# Patient Record
Sex: Male | Born: 1970 | Race: White | Hispanic: No | Marital: Married | Smoking: Former smoker
Health system: Southern US, Community
[De-identification: ages and names within clinical notes are randomized; demographics above are authoritative.]

## PROBLEM LIST (undated history)

## (undated) DIAGNOSIS — E785 Hyperlipidemia, unspecified: Secondary | ICD-10-CM

## (undated) DIAGNOSIS — K219 Gastro-esophageal reflux disease without esophagitis: Secondary | ICD-10-CM

## (undated) HISTORY — PX: NO PAST SURGERIES: SHX2092

## (undated) HISTORY — DX: Hyperlipidemia, unspecified: E78.5

---

## 2011-03-31 ENCOUNTER — Encounter: Payer: Self-pay | Admitting: Emergency Medicine

## 2011-03-31 ENCOUNTER — Other Ambulatory Visit: Payer: Self-pay

## 2011-03-31 ENCOUNTER — Observation Stay (HOSPITAL_COMMUNITY)
Admission: EM | Admit: 2011-03-31 | Discharge: 2011-04-01 | Disposition: A | Discharge status: Home / Self Care | Payer: Self-pay | Attending: Internal Medicine | Admitting: Internal Medicine

## 2011-03-31 ENCOUNTER — Emergency Department (HOSPITAL_COMMUNITY): Payer: Self-pay

## 2011-03-31 DIAGNOSIS — R209 Unspecified disturbances of skin sensation: Secondary | ICD-10-CM | POA: Insufficient documentation

## 2011-03-31 DIAGNOSIS — R079 Chest pain, unspecified: Secondary | ICD-10-CM

## 2011-03-31 DIAGNOSIS — K219 Gastro-esophageal reflux disease without esophagitis: Secondary | ICD-10-CM

## 2011-03-31 DIAGNOSIS — R0789 Other chest pain: Principal | ICD-10-CM | POA: Insufficient documentation

## 2011-03-31 DIAGNOSIS — E7800 Pure hypercholesterolemia, unspecified: Secondary | ICD-10-CM | POA: Insufficient documentation

## 2011-03-31 DIAGNOSIS — M79609 Pain in unspecified limb: Secondary | ICD-10-CM | POA: Insufficient documentation

## 2011-03-31 DIAGNOSIS — R61 Generalized hyperhidrosis: Secondary | ICD-10-CM | POA: Insufficient documentation

## 2011-03-31 HISTORY — DX: Gastro-esophageal reflux disease without esophagitis: K21.9

## 2011-03-31 LAB — POCT I-STAT TROPONIN I: Troponin i, poc: 0 ng/mL (ref 0.00–0.08)

## 2011-03-31 LAB — BASIC METABOLIC PANEL
BUN: 12 mg/dL (ref 6–23)
CO2: 27 mEq/L (ref 19–32)
Calcium: 9.5 mg/dL (ref 8.4–10.5)
Chloride: 101 mEq/L (ref 96–112)
Creatinine, Ser: 1.19 mg/dL (ref 0.50–1.35)
GFR calc Af Amer: 88 mL/min — ABNORMAL LOW (ref >90)
GFR calc non Af Amer: 76 mL/min — ABNORMAL LOW (ref >90)
Glucose, Bld: 95 mg/dL (ref 70–99)
Potassium: 3.8 mEq/L (ref 3.5–5.1)
Sodium: 138 mEq/L (ref 135–145)

## 2011-03-31 LAB — TROPONIN I: Troponin I: <0.3 ng/mL (ref <0.30)

## 2011-03-31 LAB — CBC
HCT: 41.9 % (ref 39.0–52.0)
Hemoglobin: 14.7 g/dL (ref 13.0–17.0)
MCH: 28.4 pg (ref 26.0–34.0)
MCHC: 35.1 g/dL (ref 30.0–36.0)
MCV: 81 fL (ref 78.0–100.0)
Platelets: 203 10*3/uL (ref 150–400)
RBC: 5.17 MIL/uL (ref 4.22–5.81)
RDW: 12.1 % (ref 11.5–15.5)
WBC: 7 10*3/uL (ref 4.0–10.5)

## 2011-03-31 NOTE — H&P (Signed)
PCP:   No primary provider on file.   Chief Complaint:  Chest pressure x1 week  HPI: Alejandro Delgado is an 40 y.o. Caucasian male.   No prior history of cardiac disease. History of GERD but no medical followup for over 4 years since being unemployed. Takes TUMS episodically. For the past one week has been having retrosternal chest tightness and pressure, fairly constant with no obvious aggravating or relieving factors; not associated with dizziness diaphoresis shortness of breath or palpitations. Has also been having episodic lightning sharp central chest pains, also for one week, lasting for about 1 second, and associated with numbness and tingling in his left arm. Again no obvious aggravating or relieving factors. He has tried no medications for relief, but when he had 4 episodes of the sharp chest pain today he  drove himself to the emergency room to be evaluated.  He has had a normal EKG, negative cardiac enzymes, but because he has no outpatient medical follow up, the hospitalist service has been called to assist with management.  Used to smoke heavily as much as 5 packs cigarettes per day, in addition to chewing tobacco, but discontinued in the mid 1990s. Also used to drink heavily 12-24 beers per day, but discontinued this gradually in the past 5 months. Noticed a significant improvement in his GERD symptoms when he quit tobacco and alcohol.   Despite being told that he needs to have regular colonoscopies  starting at age 70, he has not yet had his first.  Rewiew of Systems:  The patient denies anorexia, fever, weight loss,, vision loss, decreased hearing, hoarseness,  syncope, dyspnea on exertion, peripheral edema, balance deficits, hemoptysis, abdominal pain, melena, hematochezia, severe indigestion/heartburn, hematuria, incontinence, genital sores, muscle weakness, suspicious skin lesions, transient blindness, difficulty walking, depression, unusual weight change, abnormal bleeding, enlarged  lymph nodes, angioedema, and breast masses. Denies insomnia.   Past Medical History  Diagnosis Date  . GERD (gastroesophageal reflux disease)     History reviewed. No pertinent past surgical history.  Medications:  HOME MEDS: Prior to Admission medications   Medication Sig Start Date End Date Taking? Authorizing Provider  aspirin EC 81 MG tablet Take 81 mg by mouth every 4 (four) hours as needed. For tightness in chest    Yes Historical Provider, MD  calcium carbonate (TUMS - DOSED IN MG ELEMENTAL CALCIUM) 500 MG chewable tablet Chew 1 tablet by mouth 5 (five) times daily as needed. For heartburn    Yes Historical Provider, MD     Allergies:  Allergies  Allergen Reactions  . Esomeprazole Palpitations    Allergy to "GenericNexium"; then switched to Nexium and had no problem    Social History:   reports that he quit smoking about 17 years ago. He quit smokeless tobacco use about 17 years ago. His smokeless tobacco use included Chew. He reports that he drinks alcohol. He reports that he does not use illicit drugs.  Family History: Family History  Problem Relation Age of Onset  . Cancer Mother 38    Died about 66yrs  . Cancer Father 41    Died about 4yrs  . Cancer Sister     skin cancer     Physical Exam: Filed Vitals:   03/31/11 1926 03/31/11 1950 03/31/11 2040 03/31/11 2225  BP: 151/91 134/87 130/88 124/56  Pulse: 70 69 62 59  Temp: 98.4 F (36.9 C)     TempSrc: Oral     Resp: 18 17 16    Height: 5\' 10"  (  1.778 m)     Weight: 88.451 kg (195 lb)     SpO2: 100% 98% 97% 98%   Blood pressure 124/56, pulse 59, temperature 98.4 F (36.9 C), temperature source Oral, resp. rate 16, height 5\' 10"  (1.778 m), weight 88.451 kg (195 lb), SpO2 98.00%.  GEN:  Pleasant obese Caucasian gentleman lying in the stretcher in no acute distress; cooperative with exam PSYCH:  alert and oriented x4; does not appear anxious does not appear depressed; affect is normal HEENT: Mucous  membranes pink and anicteric; PERRLA; EOM intact; no cervical lymphadenopathy nor thyromegaly or carotid bruit; no JVD; Breasts:: Not examined CHEST WALL: No tenderness CHEST: Normal respiration, clear to auscultation bilaterally HEART: Regular rate and rhythm; no murmurs rubs or gallops BACK: No kyphosis or scoliosis; no CVA tenderness ABDOMEN: Obese, soft non-tender; no masses, no organomegaly, normal abdominal bowel sounds; Rectal Exam: Not done EXTREMITIES: No bone or joint deformity;  no edema; no ulcerations. Genitalia: not examined PULSES: 2+ and symmetric SKIN: Normal hydration no rash or ulceration CNS: Cranial nerves 2-12 grossly intact no focal neurologic deficit   Labs & Imaging Results for orders placed during the hospital encounter of 03/31/11 (from the past 48 hour(s))  CBC     Status: Normal   Collection Time   03/31/11  7:44 PM      Component Value Range Comment   WBC 7.0  4.0 - 10.5 (K/uL)    RBC 5.17  4.22 - 5.81 (MIL/uL)    Hemoglobin 14.7  13.0 - 17.0 (g/dL)    HCT 78.2  95.6 - 21.3 (%)    MCV 81.0  78.0 - 100.0 (fL)    MCH 28.4  26.0 - 34.0 (pg)    MCHC 35.1  30.0 - 36.0 (g/dL)    RDW 08.6  57.8 - 46.9 (%)    Platelets 203  150 - 400 (K/uL)   BASIC METABOLIC PANEL     Status: Abnormal   Collection Time   03/31/11  7:44 PM      Component Value Range Comment   Sodium 138  135 - 145 (mEq/L)    Potassium 3.8  3.5 - 5.1 (mEq/L)    Chloride 101  96 - 112 (mEq/L)    CO2 27  19 - 32 (mEq/L)    Glucose, Bld 95  70 - 99 (mg/dL)    BUN 12  6 - 23 (mg/dL)    Creatinine, Ser 6.29  0.50 - 1.35 (mg/dL)    Calcium 9.5  8.4 - 10.5 (mg/dL)    GFR calc non Af Amer 76 (*) >90 (mL/min)    GFR calc Af Amer 88 (*) >90 (mL/min)   TROPONIN I     Status: Normal   Collection Time   03/31/11  7:44 PM      Component Value Range Comment   Troponin I <0.30  <0.30 (ng/mL)   POCT I-STAT TROPONIN I     Status: Normal   Collection Time   03/31/11  8:03 PM      Component Value  Range Comment   Troponin i, poc 0.00  0.00 - 0.08 (ng/mL)    Comment 3             Dg Chest 2 View  03/31/2011  *RADIOLOGY REPORT*  Clinical Data: Chest pain.  CHEST - 2 VIEW  Comparison: None  Findings: The cardiac silhouette, mediastinal and hilar contours are within normal limits.  The lungs are clear.  No pleural effusion.  The bony thorax is intact.  IMPRESSION: No acute cardiopulmonary findings.  Original Report Authenticated By: P. Loralie Champagne, M.D.      Assessment Present on Admission:  .Chest pain .GERD (gastroesophageal reflux disease) Obesity  PLAN: Will bring this gentleman on observation for cardiac rule out. [facility] consult cardiology and keep him n.p.o. after midnight in case he may benefit from stress testing tomorrow. He may however be better served with an outpatient stress test.  Reemphasize the importance of a colonoscopy as an outpatient.  Other plans as per orders.   Alejandro Delgado 03/31/2011, 11:41 PM

## 2011-03-31 NOTE — ED Notes (Signed)
Attempted to call report, was told the nurse would have to call me back. 

## 2011-03-31 NOTE — ED Provider Notes (Signed)
Scribed for Alejandro Razor, MD, the patient was seen in room APA11/APA11 . This chart was scribed by Ellie Lunch.   CSN: [id number] Arrival date & time: 03/31/2011  7:31 PM   First MD Initiated Contact with Patient 03/31/11 1935     8:09 PM patient first seen by EDMD  Chief Complaint  Patient presents with  . Chest Pain    (Consider location/radiation/quality/duration/timing/severity/associated sxs/prior treatment) HPI Alejandro Delgado is a 40 y.o. male who presents to the Emergency Department complaining of 1 week of sharp, intermittent cental chest pain which started on 11/9.  Each episode lasts a few seconds. Some episodes of pain are associated with SOB and tingling  in his left arm  Nothing initiates the pain. Pain is not affected by changing positions, exertion, or palpation. Pt reports that after episode is over there is a lingering pressure present in his chest.  Pt is experiencing associated chills and sweating.  Pt denies rash, h/o anxiety, swelling, leg pain, or any recent long trips. Pt has taken aspirin with little relief.  Pt has no family history of heart disease.  Pt reports he has no PCP or current insurance.      Past Medical History  Diagnosis Date  . GERD (gastroesophageal reflux disease)     History reviewed. No pertinent past surgical history.  No family history on file.  History  Substance Use Topics  . Smoking status: Former Games developer  . Smokeless tobacco: Not on file  . Alcohol Use: Yes     occ, but states he quit this past summer      Review of Systems 10 Systems reviewed and are negative for acute change except as noted in the HPI.   Allergies  Esomeprazole  Home Medications   Current Outpatient Rx  Name Route Sig Dispense Refill  . ASPIRIN EC 81 MG PO TBEC Oral Take 81 mg by mouth every 4 (four) hours as needed. For tightness in chest     . CALCIUM CARBONATE ANTACID 500 MG PO CHEW Oral Chew 1 tablet by mouth 5 (five) times daily as needed. For  heartburn       BP 134/87  Pulse 69  Temp(Src) 98.4 F (36.9 C) (Oral)  Resp 17  Ht 5\' 10"  (1.778 m)  Wt 195 lb (88.451 kg)  BMI 27.98 kg/m2  SpO2 98%  Physical Exam  Nursing note and vitals reviewed. Constitutional: He is oriented to person, place, and time. He appears well-developed and well-nourished. No distress.  HENT:  Head: Normocephalic and atraumatic.  Eyes: Conjunctivae are normal. Right eye exhibits no discharge. Left eye exhibits no discharge.  Neck: Normal range of motion. Neck supple.  Cardiovascular: Normal rate, regular rhythm and normal heart sounds.  Exam reveals no gallop and no friction rub.   No murmur heard.      Chest pain not reproducible   Pulmonary/Chest: Effort normal and breath sounds normal. No respiratory distress.  Abdominal: Soft. He exhibits no distension. There is no tenderness.  Musculoskeletal: He exhibits no edema and no tenderness.  Neurological: He is alert and oriented to person, place, and time.  Skin: Skin is warm and dry.  Psychiatric: He has a normal mood and affect. His behavior is normal. Thought content normal.    ED Course  Procedures (including critical care time)  Labs Reviewed  BASIC METABOLIC PANEL - Abnormal; Notable for the following:    GFR calc non Af Amer 76 (*)    GFR calc Af Amer 88 (*)  All other components within normal limits  CBC  TROPONIN I  POCT I-STAT TROPONIN I   Dg Chest 2 View  03/31/2011  *RADIOLOGY REPORT*  Clinical Data: Chest pain.  CHEST - 2 VIEW  Comparison: None  Findings: The cardiac silhouette, mediastinal and hilar contours are within normal limits.  The lungs are clear.  No pleural effusion.  The bony thorax is intact.  IMPRESSION: No acute cardiopulmonary findings.  Original Report Authenticated By: P. Loralie Champagne, M.D.   OTHER DATA REVIEWED: Nursing notes, vital signs, and past medical records reviewed.    DIAGNOSTIC STUDIES: Oxygen Saturation is 98% on room air, normal by my  interpretation.    EKG:  Rhythm: normal sinus Rate: 70 Axis:normal Intervals:normal ST segments: flipped t wave in III and flattening in aVF. No comparison.   No diagnosis found.  8:12 PM EDMD advises pt to get a stress test.  Discusses resources to get stress test w/o insurance.    MDM  40yM with CP. EKG nondiagnostic. NS ST changes inferiorly. Trop wnl. CXR without acute change. CP somewhat atypical with very brief stabbing pain but intermittent substernal pressure between episodes with radiation to LUE concerning. Pt with few risk factors aside from hx of smoking. Consider myo/pericarditis but low clinical suspicion. No preceding viral illness. CXR not suggestive of large effusion. No rub. Low clinical suspicion for PE. Discussed outpt stress testing with pt. Pt does not have PCP or health insurance. Concerned about timely follow-up. Will admit for r/o.    I personally preformed the services scribed in my presence. The recorded information has been reviewed and considered. Alejandro Razor, MD.      Alejandro Razor, MD 03/31/11 [phone]

## 2011-03-31 NOTE — ED Notes (Signed)
Patient states last Friday had sharp chest pain in left side of chest, states his left arm felt tingly.  Patient states has had intermittent chest pressure all this week; denies N/V, states sometimes pain is so sharp it takes his breath and gives him cold chills.

## 2011-03-31 NOTE — ED Notes (Signed)
Patient denies pain and is resting comfortably.  

## 2011-03-31 NOTE — ED Notes (Signed)
Gave patient ice chips as requested. 

## 2011-04-01 ENCOUNTER — Encounter (HOSPITAL_COMMUNITY): Payer: Self-pay | Admitting: *Deleted

## 2011-04-01 LAB — BASIC METABOLIC PANEL
CO2: 29 mEq/L (ref 19–32)
Calcium: 9.1 mg/dL (ref 8.4–10.5)
GFR calc non Af Amer: 79 mL/min — ABNORMAL LOW (ref >90)
Glucose, Bld: 103 mg/dL — ABNORMAL HIGH (ref 70–99)
Potassium: 4.3 mEq/L (ref 3.5–5.1)
Sodium: 141 mEq/L (ref 135–145)

## 2011-04-01 LAB — CARDIAC PANEL(CRET KIN+CKTOT+MB+TROPI)
Relative Index: INVALID (ref 0.0–2.5)
Total CK: 92 U/L (ref 7–232)

## 2011-04-01 LAB — CREATININE, SERUM: Creatinine, Ser: 1.15 mg/dL (ref 0.50–1.35)

## 2011-04-01 LAB — LIPID PANEL
Cholesterol: 217 mg/dL — ABNORMAL HIGH (ref 0–200)
Total CHOL/HDL Ratio: 5.4 RATIO
Triglycerides: 209 mg/dL — ABNORMAL HIGH (ref <150)
VLDL: 42 mg/dL — ABNORMAL HIGH (ref 0–40)

## 2011-04-01 LAB — TSH: TSH: 1.59 u[IU]/mL (ref 0.350–4.500)

## 2011-04-01 MED ORDER — POTASSIUM CHLORIDE IN NACL 20-0.9 MEQ/L-% IV SOLN
INTRAVENOUS | Status: DC
  Administered 2011-04-01: 01:00:00 via INTRAVENOUS

## 2011-04-01 MED ORDER — BISACODYL 10 MG RE SUPP: 10.0000 mg | RECTAL | Status: DC | PRN

## 2011-04-01 MED ORDER — DOCUSATE SODIUM 100 MG PO CAPS: 100.0000 mg | ORAL_CAPSULE | Freq: Two times a day (BID) | ORAL | Status: DC | PRN

## 2011-04-01 MED ORDER — FLEET ENEMA 7-19 GM/118ML RE ENEM: 1.0000 | ENEMA | RECTAL | Status: DC | PRN

## 2011-04-01 MED ORDER — OXYCODONE HCL 5 MG PO TABS
5.0000 mg | ORAL_TABLET | ORAL | Status: DC | PRN
  Administered 2011-04-01: 5 mg via ORAL
  Filled 2011-04-01: qty 1

## 2011-04-01 MED ORDER — ACETAMINOPHEN 650 MG RE SUPP: 650.0000 mg | Freq: Four times a day (QID) | RECTAL | Status: DC | PRN

## 2011-04-01 MED ORDER — ASPIRIN EC 81 MG PO TBEC
81.0000 mg | DELAYED_RELEASE_TABLET | Freq: Every day | ORAL | Status: DC
  Administered 2011-04-01: 81 mg via ORAL
  Filled 2011-04-01: qty 1

## 2011-04-01 MED ORDER — ENOXAPARIN SODIUM 40 MG/0.4ML ~~LOC~~ SOLN
40.0000 mg | Freq: Every day | SUBCUTANEOUS | Status: DC
  Administered 2011-04-01: 40 mg via SUBCUTANEOUS
  Filled 2011-04-01: qty 0.4

## 2011-04-01 MED ORDER — KETOROLAC TROMETHAMINE 30 MG/ML IJ SOLN
30.0000 mg | Freq: Once | INTRAMUSCULAR | Status: AC
  Administered 2011-04-01: 30 mg via INTRAVENOUS
  Filled 2011-04-01: qty 1

## 2011-04-01 MED ORDER — MORPHINE SULFATE 2 MG/ML IJ SOLN: 1.0000 mg | INTRAMUSCULAR | Status: DC | PRN

## 2011-04-01 MED ORDER — ONDANSETRON HCL 4 MG PO TABS: 4.0000 mg | ORAL_TABLET | Freq: Four times a day (QID) | ORAL | Status: DC | PRN

## 2011-04-01 MED ORDER — PANTOPRAZOLE SODIUM 40 MG PO TBEC
40.0000 mg | DELAYED_RELEASE_TABLET | Freq: Two times a day (BID) | ORAL | Status: DC
  Administered 2011-04-01 (×2): 40 mg via ORAL
  Filled 2011-04-01 (×2): qty 1

## 2011-04-01 MED ORDER — NITROGLYCERIN 0.4 MG SL SUBL: 0.4000 mg | SUBLINGUAL_TABLET | SUBLINGUAL | Status: DC | PRN

## 2011-04-01 MED ORDER — TRAZODONE HCL 50 MG PO TABS: 25.0000 mg | ORAL_TABLET | Freq: Every evening | ORAL | Status: DC | PRN

## 2011-04-01 MED ORDER — ONDANSETRON HCL 4 MG/2ML IJ SOLN: 4.0000 mg | Freq: Four times a day (QID) | INTRAMUSCULAR | Status: DC | PRN

## 2011-04-01 MED ORDER — HYDROCODONE-ACETAMINOPHEN 5-500 MG PO CAPS: 1.0000 | ORAL_CAPSULE | Freq: Four times a day (QID) | ORAL | Status: AC | PRN

## 2011-04-01 MED ORDER — ACETAMINOPHEN 325 MG PO TABS: 650.0000 mg | ORAL_TABLET | Freq: Four times a day (QID) | ORAL | Status: DC | PRN

## 2011-04-01 NOTE — Consult Note (Signed)
CARDIOLOGY CONSULT NOTE  Patient ID: Alejandro Delgado MRN: [id number] DOB/AGE: 39-Dec-1972 39 y.o.  Admit date: 03/31/2011 Referring PhysicianL THR Primary PhysicianNo primary provider on file. Primary Cardiologist: Alejandro Delgado) Reason for Consultation: Chest Pain Principal Problem:  *Chest pain Active Problems:  GERD (gastroesophageal reflux disease)  HPI: Alejandro Delgado is a 40 year old patient with no prior history of coronary artery disease or cardiac evaluation. The patient has no current primary care physician but has been seen by [facility] in the past for GERD symptoms. He has no prior history of diabetes hypertension and hyperlipidemia or renal disease. The patient is was in his usual state of health when he awoke about one week ago from sleep with chest pressure and numbness and tingling in his arms also states that he had a cold sweat with clamminess in his hands and in his feet. The feeling lasted several seconds and went away on its own. Since that time the patient has had constant feeling of chest pressure for one week without waxing or waning however he is also had intermittent sharp chest discomfort lasting seconds and going away. These episodes were not associated with any exertion and not exacerbated by activity or lessened by rest. The patient describes it as substernal radiating to both sides of his chest with occasional numbness in his arms. He also describes numbness and tingling in his legs with some burning feeling as well which occurs with standing for long periods of time or sitting for long periods of time. He denies associated nausea vomiting diaphoresis dizziness or weakness. He has occasional shortness of breath sometimes associated with chest pressure but not always. As result of recurrent sharp chest discomfort and also chest pressure the patient presented to the emergency room for further evaluation.  In the emergency room the patient was found to be  moderately hypertensive with a blood pressure 151/91 heart rate 70 beats per minute. He is afebrile. Labs were completed and found to be within normal limits. Cardiac enzymes were also found to be negative. EKG revealed normal sinus rhythm with no evidence of ischemia. The patient states he continues to feel the pressure in his chest but no recurrence of the sharp pain.  Review of systems complete and found to be negative unless listed above   Past Medical History  Diagnosis Date  . GERD (gastroesophageal reflux disease)     Family History  Problem Relation Age of Onset  . Cancer Mother 30    Died about 80yrs  . Cancer Father 76    Died about 51yrs  . Cancer Sister     skin cancer    History   Social History  . Marital Status: Single    Spouse Name: N/A    Number of Children: N/A  . Years of Education: N/A   Occupational History  .      unemployed    Social History Main Topics  . Smoking status: Former Smoker -- 5.0 packs/day    Quit date: 05/16/1993  . Smokeless tobacco: Former Neurosurgeon    Types: Chew    Quit date: 05/16/1993  . Alcohol Use: No     formerly 12-24 beers/day, but states he quit this past summer  . Drug Use: No  . Sexually Active: Yes   Other Topics Concern  . Not on file   Social History Narrative   Currently out of work for 2 years[place]    Past Surgical History  Procedure Date  .  No past surgeries      Prescriptions prior to admission  Medication Sig Dispense Refill  . aspirin EC 81 MG tablet Take 81 mg by mouth every 4 (four) hours as needed. For tightness in chest       . calcium carbonate (TUMS - DOSED IN MG ELEMENTAL CALCIUM) 500 MG chewable tablet Chew 1 tablet by mouth 5 (five) times daily as needed. For heartburn         Physical Exam: Blood pressure 115/78, pulse 64, temperature 98.2 F (36.8 C), temperature source Oral, resp. rate 18, height 5\' 10"  (1.778 m), weight 197 lb 1.5 oz (89.4 kg), SpO2 96.00%.   General:  Well developed, well nourished, in no acute distress Head: Eyes PERRLA, No xanthomas.   Normal cephalic and atramatic  Lungs: Clear bilaterally to auscultation and percussion. Heart: HRRR S1 S2, without MRG.  Pulses are 2+ & equal.            No carotid bruit. No JVD.  No abdominal bruits. No femoral bruits. Abdomen: Bowel sounds are positive, abdomen soft and non-tender without masses or                  Hernia's noted. Msk:  Back normal, normal gait. Normal strength and tone for age. Extremities: No clubbing, cyanosis or edema.  DP +1 Neuro: Alert and oriented X 3. Psych:  Good affect, responds appropriately  Labs:   Lab Results  Component Value Date   WBC 7.0 03/31/2011   HGB 14.7 03/31/2011   HCT 41.9 03/31/2011   MCV 81.0 03/31/2011   PLT 203 03/31/2011     Lab 04/01/11 0458  NA 141  K 4.3  CL 105  CO2 29  BUN 12  CREATININE 1.15  CALCIUM 9.1  PROT --  BILITOT --  ALKPHOS --  ALT --  AST --  GLUCOSE 103*   Lab Results  Component Value Date   CKTOTAL 92 04/01/2011   CKMB 1.8 04/01/2011   TROPONINI <0.30 04/01/2011    Lab Results  Component Value Date   CHOL 217* 04/01/2011   Lab Results  Component Value Date   HDL 40 04/01/2011   Lab Results  Component Value Date   LDLCALC 135* 04/01/2011   Lab Results  Component Value Date   TRIG 209* 04/01/2011   Lab Results  Component Value Date   CHOLHDL 5.4 04/01/2011       Radiology: Dg Chest 2 View  03/31/2011  *RADIOLOGY REPORT*  Clinical Data: Chest pain.  CHEST - 2 VIEW  Comparison: None  Findings: The cardiac silhouette, mediastinal and hilar contours are within normal limits.  The lungs are clear.  No pleural effusion.  The bony thorax is intact.  IMPRESSION: No acute cardiopulmonary findings.  Original Report Authenticated By: Alejandro Delgado, M.D.   EKG: NSR rate of 70 bpm  ASSESSMENT AND PLAN:   1. Chest pain: He presents with atypical features for cardiac etiology of chest discomfort.  The pain has been constant pressure for approximately one week without waxing and waning with occasional sharp chest discomfort. EKG and cardiac enzymes from May negative. He does not smoke drink or have family history of coronary disease. It is noted that he has mildly elevated cholesterol at 217 with an LDL 135. I believe that would be appropriate for the patient the discharged home and plan followup stress test as an outpatient. I will arrange followup in our office for outpatient stress testing. Please see followup  appointments listed on discharge summary for this information.  2. GERD: He continues to have the symptoms despite use of over-the-counter Thomas. He states that he did use a PPI in the past over it did not agree with him. He has been without a primary care physician for over 2 years secondary to income. Would consider outpatient GI followup.  3. Hypercholesterolemia: He was noted to have elevated cholesterol LDL and also triglycerides. He is advised low-dose cholesterol diet along with increased exercise. This should be followed closely. As this is a risk factor for coronary artery disease.  4. Leg pain: The tear may be musculoskeletal issues concerning this. He may need to have some further evaluation of his back for disc disease or nerve impingement. He states in the past he has been seen by a orthopedic physician in [place] with followup to have an MRI however when he lost his job he was unable to pursue this.  Signed: Bettey Mare. Lyman Bishop NP [facility] 04/01/2011, 8:57 AM Co-Sign MD   Attending note:  Patient seen and examined, data reviewed, discussed with Ms. Lawrence. 40 year old male with history of GERD, prior tobacco use and alcohol use, no regular medical followup over the last few years. He presents with chest pain symptoms, describing both a pressure-like sensation with tingling in his arms and clamminess in hands and feet, also a sharper fleeting discomfort.  Symptoms have not been clearly exertional, and there have been no definite alleviating factors. He was noted to be hypertensive at presentation with systolic blood pressure of 151, subsequent lipid profile showing total cholesterol 217, HDL 40, LDL 135. Cardiac markers have been normal, chest x-ray also without acute process. ECG is normal. He has been hemodynamically stable, no rub or gallop on examination.  He is and his fiance state that he will be establishing primary care followup with [facility] in [place]. Patient's 10 year Framingham risk for is only 2%, however in light of recent active symptoms, further risk stratification is being arranged via exercise echocardiogram with office followup to review.  Jonelle Sidle, M.D., F.A.C.C.

## 2011-04-01 NOTE — Discharge Summary (Signed)
Physician Discharge Summary  Patient ID: Alejandro Delgado MRN: [id number] DOB/AGE: [birth year] 40 y.o.  Admit date: 03/31/2011 Discharge date: 04/01/2011  Discharge Diagnoses:  Principal Problem:  *Chest pain Active Problems:  GERD (gastroesophageal reflux disease)   Current Discharge Medication List    START taking these medications   Details  hydrocodone-acetaminophen (LORCET-HD) 5-500 MG per capsule Take 1 capsule by mouth every 6 (six) hours as needed for pain. Qty: 10 capsule, Refills: 0      CONTINUE these medications which have NOT CHANGED   Details  aspirin EC 81 MG tablet Take 81 mg by mouth every 4 (four) hours as needed. For tightness in chest     calcium carbonate (TUMS - DOSED IN MG ELEMENTAL CALCIUM) 500 MG chewable tablet Chew 1 tablet by mouth 5 (five) times daily as needed. For heartburn         Discharge Orders    Future Appointments: Provider: Department: Dept Phone: Center:   04/13/2011 10:30 AM [facility] S[facility]  None   04/14/2011 10:20 AM Joni Reining, NP [facility] [phone] [id number]      Follow-up Information    Follow up with [facility] Department. (to establish care)    Contact information:   [address] [place] [address] [phone]       Follow up with Joni Reining, NP on 04/14/2011. (10:20 [facility])    Contact information:   [address] [place] [address] [phone]       Follow up with [facility] STRESS [facility] on 04/12/2011. (9:30am)          Disposition: Home  Discharged Condition: Stable  Consults:  Sam McDowell  Labs:   Results for orders placed during the hospital encounter of 03/31/11 (from the past 48 hour(s))  CBC     Status: Normal   Collection Time   03/31/11  7:44 PM      Component Value Range Comment   WBC 7.0  4.0 - 10.5 (K/uL)    RBC 5.17  4.22 - 5.81 (MIL/uL)    Hemoglobin 14.7  13.0 - 17.0 (g/dL)    HCT 44.0  34.7 - 42.5 (%)    MCV 81.0  78.0 - 100.0 (fL)    MCH 28.4  26.0 - 34.0 (pg)    MCHC 35.1  30.0 - 36.0 (g/dL)    RDW 95.6  38.7 - 56.4 (%)    Platelets 203  150 - 400 (K/uL)   BASIC METABOLIC PANEL     Status: Abnormal   Collection Time   03/31/11  7:44 PM      Component Value Range Comment   Sodium 138  135 - 145 (mEq/L)    Potassium 3.8  3.5 - 5.1 (mEq/L)    Chloride 101  96 - 112 (mEq/L)    CO2 27  19 - 32 (mEq/L)    Glucose, Bld 95  70 - 99 (mg/dL)    BUN 12  6 - 23 (mg/dL)    Creatinine, Ser 3.32  0.50 - 1.35 (mg/dL)    Calcium 9.5  8.4 - 10.5 (mg/dL)    GFR calc non Af Amer 76 (*) >90 (mL/min)    GFR calc Af Amer 88 (*) >90 (mL/min)   TROPONIN I     Status: Normal   Collection Time   03/31/11  7:44 PM      Component Value Range Comment   Troponin I <0.30  <  0.30 (ng/mL)   POCT I-STAT TROPONIN I     Status: Normal   Collection Time   03/31/11  8:03 PM      Component Value Range Comment   Troponin i, poc 0.00  0.00 - 0.08 (ng/mL)    Comment 3            CREATININE, SERUM     Status: Abnormal   Collection Time   04/01/11 12:04 AM      Component Value Range Comment   Creatinine, Ser 1.15  0.50 - 1.35 (mg/dL)    GFR calc non Af Amer 79 (*) >90 (mL/min)    GFR calc Af Amer >90  >90 (mL/min)   BASIC METABOLIC PANEL     Status: Abnormal   Collection Time   04/01/11  4:58 AM      Component Value Range Comment   Sodium 141  135 - 145 (mEq/L)    Potassium 4.3  3.5 - 5.1 (mEq/L)    Chloride 105  96 - 112 (mEq/L)    CO2 29  19 - 32 (mEq/L)    Glucose, Bld 103 (*) 70 - 99 (mg/dL)    BUN 12  6 - 23 (mg/dL)    Creatinine, Ser 1.61  0.50 - 1.35 (mg/dL)    Calcium 9.1  8.4 - 10.5 (mg/dL)    GFR calc non Af Amer 79 (*) >90 (mL/min)    GFR calc Af Amer >90  >90 (mL/min)   LIPID PANEL     Status: Abnormal   Collection Time   04/01/11  4:58 AM      Component Value Range Comment   Cholesterol 217 (*) 0 - 200 (mg/dL)     Triglycerides 096 (*) <150 (mg/dL)    HDL 40  >04 (mg/dL)    Total CHOL/HDL Ratio 5.4      VLDL 42 (*) 0 - 40 (mg/dL)    LDL Cholesterol 540 (*) 0 - 99 (mg/dL)   CARDIAC PANEL(CRET KIN+CKTOT+MB+TROPI)     Status: Normal   Collection Time   04/01/11  5:00 AM      Component Value Range Comment   Total CK 92  7 - 232 (U/L)    CK, MB 1.8  0.3 - 4.0 (ng/mL)    Troponin I <0.30  <0.30 (ng/mL)    Relative Index RELATIVE INDEX IS INVALID  0.0 - 2.5      Diagnostics:  Dg Chest 2 View  03/31/2011  *RADIOLOGY REPORT*  Clinical Data: Chest pain.  CHEST - 2 VIEW  Comparison: None  Findings: The cardiac silhouette, mediastinal and hilar contours are within normal limits.  The lungs are clear.  No pleural effusion.  The bony thorax is intact.  IMPRESSION: No acute cardiopulmonary findings.  Original Report Authenticated By: P. Loralie Champagne, M.D.    Procedures:  EKG:  NSR  Full Code   Hospital Course: See H&P for complete admission details. The patient is a 40 year old white male with history of gastroesophageal reflux disease who presents with atypical chest pain. It first occurred while he was asleep. It had a sharp and a pressure component. No shortness of breath. It had lasted on and off for a week. It was not related to food or exertion. He has no history of heart disease. EKG and cardiac enzymes were normal. He was observed overnight and ruled out for MI. Cardiology was consulted and recommended outpatient stress test. He still had some chest soreness. He was given Toradol which  relieved his pain. I suspect his pain is chest wall pain but I agree with outpatient stress test. He has a followup scheduled with a physician in [place] otherwise I have given them the phone number for the [facility] department.  Discharge Exam:  Blood pressure 115/78, pulse 64, temperature 98.2 F (36.8 C), temperature source Oral, resp. rate 18, height 5\' 10"  (1.778 m), weight 89.4 kg (197 lb 1.5 oz),  SpO2 96.00%.  General:  Comfortable Lungs: CTA without WRR CV: RRR without MGR. No chest wall tenderness. Abd:  S,NT, ND Ext : no CCE    Signed: [name] L 04/01/2011, 5:17 PM

## 2011-04-01 NOTE — Progress Notes (Signed)
UR Chart Review Completed  

## 2011-04-05 ENCOUNTER — Encounter: Payer: Self-pay | Admitting: *Deleted

## 2011-04-11 ENCOUNTER — Ambulatory Visit: Payer: Self-pay | Admitting: Adult Health

## 2011-04-11 ENCOUNTER — Other Ambulatory Visit (HOSPITAL_COMMUNITY): Payer: Self-pay

## 2011-04-12 ENCOUNTER — Other Ambulatory Visit (HOSPITAL_COMMUNITY): Payer: Self-pay

## 2011-04-13 ENCOUNTER — Ambulatory Visit: Payer: Self-pay | Admitting: Adult Health

## 2011-04-13 ENCOUNTER — Ambulatory Visit (HOSPITAL_COMMUNITY)
Admission: RE | Admit: 2011-04-13 | Discharge: 2011-04-13 | Disposition: A | Discharge status: Home / Self Care | Payer: Self-pay | Source: Ambulatory Visit | Attending: Cardiology | Admitting: Cardiology

## 2011-04-13 ENCOUNTER — Encounter (HOSPITAL_COMMUNITY): Payer: Self-pay | Admitting: Cardiology

## 2011-04-13 DIAGNOSIS — R072 Precordial pain: Secondary | ICD-10-CM

## 2011-04-13 DIAGNOSIS — R079 Chest pain, unspecified: Secondary | ICD-10-CM | POA: Insufficient documentation

## 2011-04-13 NOTE — Progress Notes (Signed)
Stress Lab Nurses Notes - Jeani Hawking  Kingstin Heims 04/13/2011  Reason for doing test: Chest Pain Type of test: Stress Echo Nurse performing test: Parke Poisson, RN Nuclear Medicine Tech: Not Applicable Echo Tech: Karrie Doffing MD performing test: Ival Bible Family MD: NPCP Test explained and consent signed: yes IV started: No IV started Symptoms: Fatigue & SOB Treatment/Intervention: None Reason test stopped: fatigue and reached target HR After recovery IV was: NA Patient to return to Nuc. Med at : NA Patient discharged: Home Patient's Condition upon discharge was: stable Comments: During test peak BP 172/72 & HR 167.  Recovery BP 132/72 & HR 102.  Symptoms resolved in recovery. Erskine Speed T

## 2011-04-13 NOTE — Progress Notes (Signed)
*  PRELIMINARY RESULTS* Echocardiogram Echocardiogram Stress Test has been performed.  Alejandro Delgado 04/13/2011, 10:21 AM

## 2011-04-14 ENCOUNTER — Encounter: Payer: Self-pay | Admitting: Adult Health

## 2011-04-14 ENCOUNTER — Ambulatory Visit (INDEPENDENT_AMBULATORY_CARE_PROVIDER_SITE_OTHER): Payer: Self-pay | Admitting: Adult Health

## 2011-04-14 DIAGNOSIS — I10 Essential (primary) hypertension: Secondary | ICD-10-CM

## 2011-04-14 DIAGNOSIS — R079 Chest pain, unspecified: Secondary | ICD-10-CM

## 2011-04-14 MED ORDER — HYDROCHLOROTHIAZIDE 25 MG PO TABS: 12.5000 mg | ORAL_TABLET | Freq: Every day | ORAL | Status: DC

## 2011-04-14 MED ORDER — ALPRAZOLAM 0.5 MG PO TABS: 0.5000 mg | ORAL_TABLET | ORAL | Status: AC | PRN

## 2011-04-14 NOTE — Assessment & Plan Note (Signed)
Stress echo was read by Dr. Diona Browner and found to be negative for ischemia. EF was found to be 60-65% with no WMA.  This has been explained to him and this is reassuring to him.  He thinks that it may be anxiety induced as he has been under a lot of stress recently. He asked for a low dose antianxiety medication until he sees his PCP at [facility].  I have given him a Rx for xanax 0.05 mg BID with no refills.  He is to see his PCP for continuation at their discretion.

## 2011-04-14 NOTE — Progress Notes (Signed)
    HPI: Alejandro Delgado is a 40 y/o patient of Dr. Diona Browner we are following after completing stress echo in the setting of recurrent chest pain described as substernal radiating to both arms with occasional numbness. He was found also to be mildly hypertensive in ER visit for similar symptoms.  He is here for discussion of test results.  He has had no further complaints of chest pain.  I was present during his stress echo and found him to have a hypertensive response to exercise.  Allergies  Allergen Reactions  . Esomeprazole Palpitations    Allergy to "Generic" Nexium; then switched to Nexium and had no problem    Current Outpatient Prescriptions  Medication Sig Dispense Refill  . aspirin EC 81 MG tablet Take 81 mg by mouth every 4 (four) hours as needed. For tightness in chest       . calcium carbonate (TUMS - DOSED IN MG ELEMENTAL CALCIUM) 500 MG chewable tablet Chew 1 tablet by mouth 5 (five) times daily as needed. For heartburn       . ALPRAZolam (XANAX) 0.5 MG tablet Take 1 tablet (0.5 mg total) by mouth as needed for sleep.  60 tablet  0  . hydrochlorothiazide (HYDRODIURIL) 25 MG tablet Take 0.5 tablets (12.5 mg total) by mouth daily.  90 tablet  4    Past Medical History  Diagnosis Date  . GERD (gastroesophageal reflux disease)     Past Surgical History  Procedure Date  . No past surgeries     ZOX:WRUEAV of systems complete and found to be negative unless listed above PHYSICAL EXAM BP 136/95  Pulse 62  Ht 5\' 10"  (1.778 m)  Wt 194 lb (87.998 kg)  BMI 27.84 kg/m2  SpO2 95% General: Well developed, well nourished, in no acute distress Head: Eyes PERRLA, No xanthomas.   Normal cephalic and atramatic  Lungs: Clear bilaterally to auscultation and percussion. Heart: HRRR S1 S2, without MRG.  Pulses are 2+ & equal.            No carotid bruit. No JVD.  No abdominal bruits. No femoral bruits. Abdomen: Bowel sounds are positive, abdomen soft and non-tender without masses or              Hernia's noted. Msk:  Back normal, normal gait. Normal strength and tone for age. Extremities: No clubbing, cyanosis or edema.  DP +1 Neuro: Alert and oriented X 3. Psych:  Good affect, responds appropriately    ASSESSMENT AND PLAN

## 2011-04-14 NOTE — Patient Instructions (Signed)
**Note De-Identified Clorine Swing Obfuscation** Your physician has recommended you make the following change in your medication: start taking HCTZ 12.5 mg daily and Xanax 0.5 mg (we have given you a prescription for 60 tablets to be taken as needed, please have your primary care MD refill this medication for you)  Your physician recommends that you schedule a follow-up appointment in: 6 months  Your physician recommends that you start a Low Cholesterol diet and daily exercise/walking, please see handouts given to you at today's visit.

## 2011-05-23 ENCOUNTER — Telehealth: Payer: Self-pay | Admitting: Adult Health

## 2011-05-23 NOTE — Telephone Encounter (Signed)
**Note De-Identified Jvion Turgeon Obfuscation** Pt. was told at last office visit to f/u with his PCP for refills on Xanax. Do you want to refill?

## 2011-05-23 NOTE — Telephone Encounter (Signed)
No.  Follow-up with PCP as directed.  Alejandro Delgado

## 2011-05-23 NOTE — Telephone Encounter (Signed)
PT HAS CALLED PCP FOR REFILL ON ALPRAZOLAM AND THEY WILL NOT REFILL IT AND WANT TO KNOW IF KATHRYN LAWRENCE WILL.TMJ  PLEASE CALL INTO [facility] FAX [phone] PHONE [phone].

## 2011-05-24 ENCOUNTER — Telehealth: Payer: Self-pay | Admitting: Adult Health

## 2011-05-24 NOTE — Telephone Encounter (Signed)
Pt. is advised to get a PCP or call Health Dept. For appt., he expressed verbal understanding./LV

## 2011-05-24 NOTE — Telephone Encounter (Signed)
Patient's fiancee states that patient went to [facility] but they don't prescribe controlled substances.  Wanted to refer him to Mental Health and have him put on Prozac.  States that Alprazolam was prescribed for tightness in chest.  States that he does not need anti-depressant.  Wants Samara Deist to call and speak with patient when she has time. / tg

## 2015-06-22 ENCOUNTER — Ambulatory Visit: Payer: Self-pay | Admitting: Pediatrics

## 2015-06-29 ENCOUNTER — Ambulatory Visit: Payer: Self-pay | Admitting: Pediatrics

## 2017-01-27 ENCOUNTER — Ambulatory Visit (INDEPENDENT_AMBULATORY_CARE_PROVIDER_SITE_OTHER): Payer: BLUE CROSS/BLUE SHIELD | Admitting: Family Medicine

## 2017-01-27 ENCOUNTER — Encounter: Payer: Self-pay | Admitting: Family Medicine

## 2017-01-27 ENCOUNTER — Encounter (INDEPENDENT_AMBULATORY_CARE_PROVIDER_SITE_OTHER): Payer: Self-pay

## 2017-01-27 VITALS — BP 150/96 | HR 69 | Temp 97.2°F | Ht 70.0 in | Wt 212.2 lb

## 2017-01-27 DIAGNOSIS — F39 Unspecified mood [affective] disorder: Secondary | ICD-10-CM

## 2017-01-27 DIAGNOSIS — Z800 Family history of malignant neoplasm of digestive organs: Secondary | ICD-10-CM | POA: Insufficient documentation

## 2017-01-27 DIAGNOSIS — E782 Mixed hyperlipidemia: Secondary | ICD-10-CM | POA: Diagnosis not present

## 2017-01-27 DIAGNOSIS — Z1159 Encounter for screening for other viral diseases: Secondary | ICD-10-CM

## 2017-01-27 DIAGNOSIS — K429 Umbilical hernia without obstruction or gangrene: Secondary | ICD-10-CM | POA: Diagnosis not present

## 2017-01-27 DIAGNOSIS — K219 Gastro-esophageal reflux disease without esophagitis: Secondary | ICD-10-CM | POA: Diagnosis not present

## 2017-01-27 DIAGNOSIS — B009 Herpesviral infection, unspecified: Secondary | ICD-10-CM

## 2017-01-27 MED ORDER — MIRTAZAPINE 15 MG PO TABS: 15.0000 mg | ORAL_TABLET | Freq: Every day | ORAL | 3 refills | Status: DC

## 2017-01-27 NOTE — Patient Instructions (Signed)
Great to meet you!  Come back in 3-4 weeks  Start remeron 1 pill once daily at night, about 1 hour before bed.   Come back within 1 -2 weeks for labs fasting.

## 2017-01-27 NOTE — Progress Notes (Signed)
HPI  Patient presents today here to establish care.  Patient has a few concerns today.  Anxiety and sleep. Patient has difficulty sleeping, also has severe social anxiety and feelings of worry. He also has some anhedonia after discussion No SI.  Patient reports tender umbilical swelling that's worse with bearing down and turning to the left. The started after moving some heavy furniture.  Family history of colon cancer Mother and father died from colon cancer, patient states he believes it started in her early 70s and reports that they both died in their 66s.  GERD Patient uses Nexium 40 mg over-the-counter, he states his insurance will not cover this, his previous provider was wanting him to see GI to consider EGD.  Reviewed his labs from his previous provider in detail Has a positive HSV-1 antibody, he has never had a painful lesion characteristic of clinical disease. LDL was previously 160, then improved to 128. He did not try a statin which was prescribed.  He was previously started on amitriptyline for sleep which he did not try as well.  LDJ:TTSV, hyperlipidemia Surgical history: None Family history father and mother with colon cancer Social history: Former smoker, recently stopped using alcohol No drug use  ROS: Per HPI  Objective: BP (!) 150/96   Pulse 69   Temp (!) 97.2 F (36.2 C) (Oral)   Ht '5\' 10"'  (1.778 m)   Wt 212 lb 3.2 oz (96.3 kg)   BMI 30.45 kg/m  Gen: NAD, alert, cooperative with exam HEENT: NCAT, EOMI, PERRL, Oropharynx moist and clear, TMs normal bilaterally, nares clear CV: RRR, good S1/S2, no murmur Resp: CTABL, no wheezes, non-labored Abd: SNTND, BS present, no guarding or organomegaly, Small tender easily reducible umbilical hernia, mildly tender to palpation, no signs of incarceration  Ext: No edema, warm Neuro: Alert and oriented, No gross deficits  Assessment and plan:  # Mood disorder Mixed symptoms with social anxiety and  anhedonia. 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 For now will proceed with watchful waiting.  Elevated blood pressure, patient does have history of hypertension, for now we will continue to follow given that he is embarking on significant lifestyle improvement.    Orders Placed This Encounter  Procedures  . Lipid panel    Standing Status:   Future    Standing Expiration Date:   01/27/2018  . CBC with Differential/Platelet    Standing Status:   Future    Standing Expiration Date:   01/27/2018  . CMP14+EGFR    Standing Status:   Future    Standing Expiration Date:   01/27/2018  . TSH    Standing Status:   Future    Standing Expiration Date:   01/27/2018  . Ambulatory referral to Gastroenterology    Referral Priority:   Routine    Referral Type:   Consultation    Referral Reason:   Specialty Services Required    Number of Visits Requested:  1  . Ambulatory referral to General Surgery    Referral Priority:   Routine    Referral Type:   Surgical    Referral Reason:   Specialty Services Required    Referred to Provider:   Virl Cagey, MD    Requested Specialty:   General Surgery    Number of Visits Requested:   1    Meds ordered this encounter  Medications  . esomeprazole (NEXIUM) 40 MG capsule    Sig: Take 40 mg by mouth daily at 12 noon.  . mirtazapine (REMERON) 15 MG tablet      Sig: Take 1 tablet (15 mg total) by mouth at bedtime.    Dispense:  30 tablet    Refill:  [place], MD [place] 01/27/2017, 2:50 PM

## 2017-01-28 ENCOUNTER — Other Ambulatory Visit: Payer: BLUE CROSS/BLUE SHIELD

## 2017-01-28 DIAGNOSIS — E782 Mixed hyperlipidemia: Secondary | ICD-10-CM

## 2017-01-29 LAB — CMP14+EGFR
A/G RATIO: 1.8 (ref 1.2–2.2)
ALK PHOS: 85 IU/L (ref 39–117)
ALT: 31 IU/L (ref 0–44)
AST: 25 IU/L (ref 0–40)
Albumin: 4.4 g/dL (ref 3.5–5.5)
BILIRUBIN TOTAL: 0.4 mg/dL (ref 0.0–1.2)
BUN / CREAT RATIO: 12 (ref 9–20)
BUN: 14 mg/dL (ref 6–24)
CHLORIDE: 102 mmol/L (ref 96–106)
CO2: 26 mmol/L (ref 20–29)
Calcium: 9.7 mg/dL (ref 8.7–10.2)
Creatinine, Ser: 1.18 mg/dL (ref 0.76–1.27)
GFR calc Af Amer: 86 mL/min/{1.73_m2} (ref >59)
GFR calc non Af Amer: 74 mL/min/{1.73_m2} (ref >59)
GLOBULIN, TOTAL: 2.5 g/dL (ref 1.5–4.5)
Glucose: 100 mg/dL — ABNORMAL HIGH (ref 65–99)
POTASSIUM: 4.9 mmol/L (ref 3.5–5.2)
SODIUM: 141 mmol/L (ref 134–144)
Total Protein: 6.9 g/dL (ref 6.0–8.5)

## 2017-01-29 LAB — CBC WITH DIFFERENTIAL/PLATELET
BASOS ABS: 0 10*3/uL (ref 0.0–0.2)
Basos: 1 %
EOS (ABSOLUTE): 0.1 10*3/uL (ref 0.0–0.4)
Eos: 2 %
HEMOGLOBIN: 14.9 g/dL (ref 13.0–17.7)
Hematocrit: 42.5 % (ref 37.5–51.0)
Immature Grans (Abs): 0 10*3/uL (ref 0.0–0.1)
Immature Granulocytes: 0 %
LYMPHS ABS: 1.9 10*3/uL (ref 0.7–3.1)
Lymphs: 33 %
MCH: 29.6 pg (ref 26.6–33.0)
MCHC: 35.1 g/dL (ref 31.5–35.7)
MCV: 85 fL (ref 79–97)
MONOCYTES: 7 %
Monocytes Absolute: 0.4 10*3/uL (ref 0.1–0.9)
NEUTROS ABS: 3.3 10*3/uL (ref 1.4–7.0)
Neutrophils: 57 %
Platelets: 238 10*3/uL (ref 150–379)
RBC: 5.03 x10E6/uL (ref 4.14–5.80)
RDW: 13.2 % (ref 12.3–15.4)
WBC: 5.7 10*3/uL (ref 3.4–10.8)

## 2017-01-29 LAB — LIPID PANEL
Chol/HDL Ratio: 5.8 ratio — ABNORMAL HIGH (ref 0.0–5.0)
Cholesterol, Total: 268 mg/dL — ABNORMAL HIGH (ref 100–199)
HDL: 46 mg/dL (ref >39)
LDL CALC: 156 mg/dL — AB (ref 0–99)
TRIGLYCERIDES: 328 mg/dL — AB (ref 0–149)
VLDL Cholesterol Cal: 66 mg/dL — ABNORMAL HIGH (ref 5–40)

## 2017-01-29 LAB — TSH: TSH: 2.47 u[IU]/mL (ref 0.450–4.500)

## 2017-02-02 ENCOUNTER — Encounter: Payer: Self-pay | Admitting: General Surgery

## 2017-02-02 ENCOUNTER — Ambulatory Visit (INDEPENDENT_AMBULATORY_CARE_PROVIDER_SITE_OTHER): Payer: Self-pay | Admitting: Internal Medicine

## 2017-02-02 ENCOUNTER — Ambulatory Visit (INDEPENDENT_AMBULATORY_CARE_PROVIDER_SITE_OTHER): Payer: BLUE CROSS/BLUE SHIELD | Admitting: General Surgery

## 2017-02-02 VITALS — BP 142/98 | HR 69 | Resp 18 | Ht 70.0 in | Wt 212.0 lb

## 2017-02-02 DIAGNOSIS — K429 Umbilical hernia without obstruction or gangrene: Secondary | ICD-10-CM | POA: Diagnosis not present

## 2017-02-02 NOTE — Patient Instructions (Signed)
Colonoscopy and then call for surgery followup.

## 2017-02-02 NOTE — Progress Notes (Signed)
[facility] History and Physical  Chief Complaint:Umbilical hernia Referring Physician: Dr. Ermalinda Memos, MD  Danta Baumgardner is an 46 y.o. male.  HPI: Mr. Teale is a pleasant 46 yo male with an umbilical hernia is has noticed for about 2 years. The hernia is causing him some discomfort with movement and limits his activity per his report. His discomfort is improved when he limits his activity or sleeps on his right side. He denies any issues with nausea, vomiting or having the hernia stick out. He has daily BMs, notices some blood and has never had a colonoscopy.  Both of his parents died at 78 from colon cancer.   He is currently working on getting scheduled for a colonoscopy.   Past Medical History:  Diagnosis Date  . GERD (gastroesophageal reflux disease)   . Hyperlipidemia     Past Surgical History:  Procedure Laterality Date  . NO PAST SURGERIES      Family History  Problem Relation Age of Onset  . Cancer Mother 23       Died about 39yrs  . Cancer Father 62       Died about 1yrs  . Cancer Sister        skin cancer    Social History:  reports that he quit smoking about 23 years ago. He smoked 5.00 packs per day. He quit smokeless tobacco use about 23 years ago. His smokeless tobacco use included Chew. He reports that he drinks alcohol. He reports that he does not use drugs.  Medications: Allergies as of 02/02/2017      Reactions   Esomeprazole Palpitations   Allergy to "Generic" Nexium; then switched to Nexium and had no problem      Medication List       Accurate as of 02/02/17  4:26 PM. Always use your most recent med list.          esomeprazole 40 MG capsule Commonly known as:  NEXIUM Take 40 mg by mouth daily at 12 noon.   mirtazapine 15 MG tablet Commonly known as:  REMERON Take 1 tablet (15 mg total) by mouth at bedtime.       ROS:  A comprehensive review of systems was negative except for: Gastrointestinal: positive for abdominal  discomfort around hernia, bloody BM  Blood pressure (!) 142/98, pulse 69, resp. rate 18, height  (1.778 m), weight 212 lb (96.2 kg). Physical Exam  Constitutional: He is oriented to person, place, and time and well-developed, well-nourished, and in no distress.  HENT:  Head: Normocephalic and atraumatic.  Eyes: Pupils are equal, round, and reactive to light.  Cardiovascular: Normal rate and regular rhythm.   Pulmonary/Chest: Effort normal and breath sounds normal.  Abdominal: Soft. He exhibits no distension. There is no tenderness. There is no rebound and no guarding.  Hernia, reducible at umbilicus, 1cm defect   Musculoskeletal: Normal range of motion. He exhibits no edema.  Neurological: He is alert and oriented to person, place, and time.  Skin: Skin is warm and dry.  Psychiatric: Mood, memory, affect and judgment normal.    Assessment/Plan: Mr. Andres is a 46 yo with an umbilical hernia that is causing some discomfort but no obstruction symptoms. 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  The patient does have an extensive family history of colon cancer, and has never had a colonoscopy. Prior to repair recommend a colonoscopy. He is already referred to Dr. Karilyn Cota for this  procedure.  Discussed with the patient the risk and benefits of repair and the use of mesh. Discussed that the repair is only necessary if he is having symptoms.   Lucretia Roers 02/02/2017, 4:21 PM

## 2017-02-16 ENCOUNTER — Other Ambulatory Visit (INDEPENDENT_AMBULATORY_CARE_PROVIDER_SITE_OTHER): Payer: Self-pay | Admitting: Internal Medicine

## 2017-02-16 ENCOUNTER — Encounter (INDEPENDENT_AMBULATORY_CARE_PROVIDER_SITE_OTHER): Payer: Self-pay | Admitting: *Deleted

## 2017-02-16 ENCOUNTER — Encounter (INDEPENDENT_AMBULATORY_CARE_PROVIDER_SITE_OTHER): Payer: Self-pay | Admitting: Internal Medicine

## 2017-02-16 ENCOUNTER — Ambulatory Visit (INDEPENDENT_AMBULATORY_CARE_PROVIDER_SITE_OTHER): Payer: BLUE CROSS/BLUE SHIELD | Admitting: Internal Medicine

## 2017-02-16 VITALS — BP 110/72 | HR 68 | Temp 97.6°F | Ht 70.0 in | Wt 211.6 lb

## 2017-02-16 DIAGNOSIS — K219 Gastro-esophageal reflux disease without esophagitis: Secondary | ICD-10-CM

## 2017-02-16 DIAGNOSIS — Z800 Family history of malignant neoplasm of digestive organs: Secondary | ICD-10-CM | POA: Insufficient documentation

## 2017-02-16 NOTE — Progress Notes (Signed)
   Subjective:    Patient ID: Alejandro Delgado, male    DOB: 1971-01-21, 46 y.o.   MRN: [id number]  HPI Referred by Dr. Kevin Fenton for reflux.   Positive family hx of colon cancer in father and mother.  Both died in their 79s.  Patient has never undergone a colonoscopy.  States has been taking Nexium OTC for over 20 yrs. He says he has acid reflux if he misses a dose.  He states he is going to have hernia surgery in the future but was told he needed a colonoscopy due to family hx . Appetite is good. No weight loss. Has a BM daily. No melena or BRRB.  States he has hemorrhoids.    Usually drinks 3 beers a day.   Review of Systems Past Medical History:  Diagnosis Date  . GERD (gastroesophageal reflux disease)   . Hyperlipidemia     Past Surgical History:  Procedure Laterality Date  . NO PAST SURGERIES      Allergies  Allergen Reactions  . Esomeprazole Palpitations    Allergy to "Generic" Nexium; then switched to Nexium and had no problem    Current Outpatient Prescriptions on File Prior to Visit  Medication Sig Dispense Refill  . esomeprazole (NEXIUM) 40 MG capsule Take 40 mg by mouth daily at 12 noon.     No current facility-administered medications on file prior to visit.         Objective:   Physical Exam Blood pressure 110/72, pulse 68, temperature 97.6 F (36.4 C), height  (1.778 m), weight 211 lb 9.6 oz (96 kg). Alert and oriented. Skin warm and dry. Oral mucosa is moist.   . Sclera anicteric, conjunctivae is pink. Thyroid not enlarged. No cervical lymphadenopathy. Lungs clear. Heart regular rate and rhythm.  Abdomen is soft. Bowel sounds are positive. No hepatomegaly. No abdominal masses felt. No tenderness.  No edema to lower extremities.           Assessment & Plan:  GERD. Has been on Nexium for over 20 yrs. PUD needs to be ruled out. Family hx of colon cancer. Colonoscopy. The risks of bleeding, perforation and infection were reviewed with  patient.

## 2017-02-16 NOTE — Patient Instructions (Signed)
The risks of bleeding, perforation and infection were reviewed with patient.  

## 2017-02-27 ENCOUNTER — Ambulatory Visit: Payer: BLUE CROSS/BLUE SHIELD | Admitting: Family Medicine

## 2017-02-28 ENCOUNTER — Encounter: Payer: Self-pay | Admitting: Family Medicine

## 2017-03-21 ENCOUNTER — Other Ambulatory Visit: Payer: Self-pay | Admitting: *Deleted

## 2017-03-21 MED ORDER — MIRTAZAPINE 15 MG PO TABS: 15.0000 mg | ORAL_TABLET | Freq: Every day | ORAL | 3 refills | Status: DC

## 2017-03-21 NOTE — Telephone Encounter (Signed)
Refilled remeron, should have follow up.  Murtis SinkSam Bradshaw, MD [facility] 03/21/2017, 12:26 PM

## 2017-03-21 NOTE — Telephone Encounter (Signed)
Left detailed message for pt 

## 2017-03-22 ENCOUNTER — Other Ambulatory Visit: Payer: Self-pay | Admitting: *Deleted

## 2017-03-22 MED ORDER — MIRTAZAPINE 15 MG PO TABS: 15.0000 mg | ORAL_TABLET | Freq: Every day | ORAL | 0 refills | Status: DC

## 2017-04-19 ENCOUNTER — Telehealth (INDEPENDENT_AMBULATORY_CARE_PROVIDER_SITE_OTHER): Payer: Self-pay | Admitting: *Deleted

## 2017-04-19 MED ORDER — PEG 3350-KCL-NA BICARB-NACL 420 G PO SOLR: 4000.0000 mL | Freq: Once | ORAL | 0 refills | Status: AC

## 2017-04-19 NOTE — Telephone Encounter (Signed)
Patient needs trilyte 

## 2017-05-24 ENCOUNTER — Telehealth (INDEPENDENT_AMBULATORY_CARE_PROVIDER_SITE_OTHER): Payer: Self-pay | Admitting: *Deleted

## 2017-05-24 NOTE — Telephone Encounter (Signed)
Noted  

## 2017-05-24 NOTE — Telephone Encounter (Signed)
Patient called 05/15/17 requesting to resch'd his TCS/EGD sch'd 05/25/17 -- I spoke to patient on 05/17/17 and we tentatively said 06/29/17 for procedures -- he was going to check his schedule and get back with me before rescheduling -- I have left multiple message on his cell for him to let me know what is going on, I also left message on wife's cell, no one has called back -- procedure has been canceled as we can't wait and let him be a no show

## 2017-05-25 ENCOUNTER — Encounter (HOSPITAL_COMMUNITY): Admission: RE | Payer: Self-pay | Source: Ambulatory Visit

## 2017-05-25 ENCOUNTER — Ambulatory Visit (HOSPITAL_COMMUNITY)
Admission: RE | Admit: 2017-05-25 | Payer: BLUE CROSS/BLUE SHIELD | Source: Ambulatory Visit | Admitting: Internal Medicine

## 2017-05-25 SURGERY — COLONOSCOPY
Anesthesia: Moderate Sedation

## 2017-05-26 ENCOUNTER — Encounter: Payer: Self-pay | Admitting: Family Medicine

## 2017-05-26 ENCOUNTER — Ambulatory Visit (INDEPENDENT_AMBULATORY_CARE_PROVIDER_SITE_OTHER): Payer: BLUE CROSS/BLUE SHIELD

## 2017-05-26 ENCOUNTER — Ambulatory Visit: Payer: BLUE CROSS/BLUE SHIELD | Admitting: Family Medicine

## 2017-05-26 ENCOUNTER — Encounter: Payer: Self-pay | Admitting: *Deleted

## 2017-05-26 VITALS — BP 143/99 | HR 66 | Temp 96.8°F | Ht 70.0 in | Wt 223.6 lb

## 2017-05-26 DIAGNOSIS — R7309 Other abnormal glucose: Secondary | ICD-10-CM

## 2017-05-26 DIAGNOSIS — R5383 Other fatigue: Secondary | ICD-10-CM

## 2017-05-26 DIAGNOSIS — I10 Essential (primary) hypertension: Secondary | ICD-10-CM

## 2017-05-26 DIAGNOSIS — M5416 Radiculopathy, lumbar region: Secondary | ICD-10-CM

## 2017-05-26 DIAGNOSIS — M545 Low back pain: Secondary | ICD-10-CM | POA: Diagnosis not present

## 2017-05-26 DIAGNOSIS — E785 Hyperlipidemia, unspecified: Secondary | ICD-10-CM | POA: Diagnosis not present

## 2017-05-26 NOTE — Patient Instructions (Signed)
Great to see you!  Come back in 2 months unless you need us sooner.    

## 2017-05-26 NOTE — Progress Notes (Signed)
   HPI  Patient presents today here for follow-up of mood disorder, elevated blood pressure, and to discuss low back pain.  Low back pain Patient states that he has had low back pain for many years, it comes and goes. He states that he has a pretty much persistent dull midline low back pain that radiates down his left anterior leg at times.  He states is worse with standing for prolonged time. He also has a catch in his back whenever he twists a certain way.  anxiety Patient tried mirtazapine and states that it made him feel wobbly following day, he used it for 3-4 weeks.  He states he has decreased sex drive, decreased energy, difficulty getting an erection, he would like his testosterone checked.  Patient states his been doing a ketogenic diet for about 4 weeks and is lost about 5 pounds.  PMH: Smoking status noted ROS: Per HPI  Objective: BP (!) 143/99   Pulse 66   Temp (!) 96.8 F (36 C) (Oral)   Ht 5' 10" (1.778 m)   Wt 223 lb 9.6 oz (101.4 kg)   BMI 32.08 kg/m  Gen: NAD, alert, cooperative with exam HEENT: NCAT, EOMI, PERRL CV: RRR, good S1/S2, no murmur Resp: CTABL, no wheezes, non-labored Ext: No edema, warm Neuro: Alert and oriented, 5/5 in bilateral lower extremities, 2+ patellar tendon reflexes bilaterally MSK Tenderness to palpation of midline spine and right-sided paraspinal muscles, less tenderness to palpation in the left paraspinal muscles Negative straight leg raise  Assessment and plan:  #Lumbar radiculopathy Patient reports symptoms for many months, he has radiation down the anterior left leg Plain film today but Refer to orthopedics  #Hypertension Elevated, slightly lower than last visit Discussed weight loss as our first goal for decreasing his blood pressure  #Hyperlipidemia Repeat labs, patient has tried the keto diet  #Decreased energy Some symptoms of hypogonadism,  patient is primarily concerned about low testosterone     Orders  Placed This Encounter  Procedures  . DG Lumbar Spine 2-3 Views    Standing Status:   Future    Number of Occurrences:   1    Standing Expiration Date:   07/26/2018    Order Specific Question:   Reason for Exam (SYMPTOM  OR DIAGNOSIS REQUIRED)    Answer:   l sided low back pain with radiculopathy anterior left leg    Order Specific Question:   Preferred imaging location?    Answer:   Internal  . Testosterone  . Lipid panel  . BMP8+EGFR  . Ambulatory referral to Orthopedic Surgery    Referral Priority:   Routine    Referral Type:   Surgical    Referral Reason:   Specialty Services Required    Requested Specialty:   Orthopedic Surgery    Number of Visits Requested:   [place], MD [facility] 05/26/2017, 11:43 AM

## 2017-05-27 LAB — LIPID PANEL
CHOL/HDL RATIO: 6.6 ratio — AB (ref 0.0–5.0)
Cholesterol, Total: 265 mg/dL — ABNORMAL HIGH (ref 100–199)
HDL: 40 mg/dL (ref >39)
TRIGLYCERIDES: 418 mg/dL — AB (ref 0–149)

## 2017-05-27 LAB — BMP8+EGFR
BUN/Creatinine Ratio: 14 (ref 9–20)
BUN: 15 mg/dL (ref 6–24)
CHLORIDE: 103 mmol/L (ref 96–106)
CO2: 24 mmol/L (ref 20–29)
Calcium: 9.1 mg/dL (ref 8.7–10.2)
Creatinine, Ser: 1.05 mg/dL (ref 0.76–1.27)
GFR calc non Af Amer: 85 mL/min/{1.73_m2} (ref >59)
GFR, EST AFRICAN AMERICAN: 99 mL/min/{1.73_m2} (ref >59)
GLUCOSE: 106 mg/dL — AB (ref 65–99)
POTASSIUM: 4.4 mmol/L (ref 3.5–5.2)
Sodium: 141 mmol/L (ref 134–144)

## 2017-05-27 LAB — TESTOSTERONE: Testosterone: 266 ng/dL (ref 264–916)

## 2017-06-05 LAB — BAYER DCA HB A1C WAIVED: HB A1C (BAYER DCA - WAIVED): 5.5 % (ref <7.0)

## 2017-06-05 NOTE — Addendum Note (Signed)
Addended by: Angela AdamOSTOSKY, Korryn Pancoast C on: 06/05/2017 09:59 AM   Modules accepted: Orders

## 2017-06-13 ENCOUNTER — Encounter: Payer: Self-pay | Admitting: *Deleted

## 2017-07-12 ENCOUNTER — Other Ambulatory Visit: Payer: Self-pay | Admitting: *Deleted

## 2017-07-12 NOTE — Telephone Encounter (Signed)
Refill denied per 05/26/17 OV notes

## 2017-07-24 ENCOUNTER — Ambulatory Visit: Payer: BLUE CROSS/BLUE SHIELD | Admitting: Family Medicine

## 2017-08-18 ENCOUNTER — Other Ambulatory Visit: Payer: Self-pay

## 2017-08-18 DIAGNOSIS — E785 Hyperlipidemia, unspecified: Secondary | ICD-10-CM

## 2017-08-18 DIAGNOSIS — I10 Essential (primary) hypertension: Secondary | ICD-10-CM

## 2017-08-19 ENCOUNTER — Other Ambulatory Visit: Payer: BLUE CROSS/BLUE SHIELD

## 2017-08-19 DIAGNOSIS — I10 Essential (primary) hypertension: Secondary | ICD-10-CM | POA: Diagnosis not present

## 2017-08-19 DIAGNOSIS — E785 Hyperlipidemia, unspecified: Secondary | ICD-10-CM | POA: Diagnosis not present

## 2017-08-22 LAB — CBC WITH DIFFERENTIAL/PLATELET
Basophils Absolute: 0 x10E3/uL (ref 0.0–0.2)
Basos: 1 %
EOS (ABSOLUTE): 0.1 x10E3/uL (ref 0.0–0.4)
Eos: 2 %
Hematocrit: 45.7 % (ref 37.5–51.0)
Hemoglobin: 14.6 g/dL (ref 13.0–17.7)
Immature Grans (Abs): 0 x10E3/uL (ref 0.0–0.1)
Immature Granulocytes: 0 %
Lymphocytes Absolute: 1.6 x10E3/uL (ref 0.7–3.1)
Lymphs: 38 %
MCH: 28.6 pg (ref 26.6–33.0)
MCHC: 31.9 g/dL (ref 31.5–35.7)
MCV: 90 fL (ref 79–97)
Monocytes Absolute: 0.2 x10E3/uL (ref 0.1–0.9)
Monocytes: 4 %
Neutrophils Absolute: 2.4 x10E3/uL (ref 1.4–7.0)
Neutrophils: 55 %
Platelets: 238 x10E3/uL (ref 150–379)
RBC: 5.1 x10E6/uL (ref 4.14–5.80)
RDW: 13.3 % (ref 12.3–15.4)
WBC: 4.3 x10E3/uL (ref 3.4–10.8)

## 2017-08-22 LAB — VITAMIN D 25 HYDROXY (VIT D DEFICIENCY, FRACTURES): VIT D 25 HYDROXY: 51.9 ng/mL (ref 30.0–100.0)

## 2017-08-22 LAB — TESTOSTERONE,FREE AND TOTAL
TESTOSTERONE FREE: 11.4 pg/mL (ref 6.8–21.5)
TESTOSTERONE: 392 ng/dL (ref 264–916)

## 2017-08-22 LAB — LIPID PANEL
Chol/HDL Ratio: 3.7 ratio (ref 0.0–5.0)
Cholesterol, Total: 171 mg/dL (ref 100–199)
HDL: 46 mg/dL (ref >39)
LDL Calculated: 100 mg/dL — ABNORMAL HIGH (ref 0–99)
Triglycerides: 124 mg/dL (ref 0–149)
VLDL Cholesterol Cal: 25 mg/dL (ref 5–40)

## 2017-08-22 LAB — LDL CHOLESTEROL, DIRECT: LDL Direct: 100 mg/dL — ABNORMAL HIGH (ref 0–99)

## 2017-08-24 ENCOUNTER — Ambulatory Visit: Payer: BLUE CROSS/BLUE SHIELD | Admitting: Family Medicine

## 2017-08-24 ENCOUNTER — Encounter: Payer: Self-pay | Admitting: Family Medicine

## 2017-08-24 VITALS — BP 126/88 | HR 56 | Temp 97.8°F | Ht 70.0 in | Wt 195.4 lb

## 2017-08-24 DIAGNOSIS — R0789 Other chest pain: Secondary | ICD-10-CM | POA: Diagnosis not present

## 2017-08-24 DIAGNOSIS — R001 Bradycardia, unspecified: Secondary | ICD-10-CM

## 2017-08-24 DIAGNOSIS — E781 Pure hyperglyceridemia: Secondary | ICD-10-CM | POA: Diagnosis not present

## 2017-08-24 NOTE — Patient Instructions (Signed)
Great to see you!   

## 2017-08-24 NOTE — Progress Notes (Signed)
   HPI  Patient presents today for follow-up hyperlipidemia and chest pain.  Patient states that he has made great lifestyle changes since our last visit.  He is watching his diet very closely, he is exercising regularly, he is cut out alcohol completely.  Patient states that a few weeks ago he had a episode of chest pain while physically exerting himself which he describes as someone pressing their finger on his left chest and leaving it there.  He then developed left hand numbness.  He is concerned about his heart. He denies any family history of heart disease. He did have uncontrolled hyperlipidemia but this is improved dramatically after his lifestyle changes.  He is not a smoker. His blood pressure was previously elevated and he did meet diagnostic criteria for hypertension.  PMH: Smoking status noted ROS: Per HPI  Objective: BP 126/88   Pulse (!) 56   Temp 97.8 F (36.6 C) (Oral)   Ht 5\' 10"  (1.778 m)   Wt 195 lb 6.4 oz (88.6 kg)   BMI 28.04 kg/m  Gen: NAD, alert, cooperative with exam HEENT: NCAT CV: RRR, good S1/S2, no murmur Resp: CTABL, no wheezes, non-labored Ext: No edema, warm Neuro: Alert and oriented, No gross deficits  Assessment and plan:  #Hyperlipidemia, hypertriglyceridemia Patient with dramatic improvement of his lipids Congratulated on his lifestyle changes  #Chest pain Atypical chest pain, however this did occur with exertion and he has changed his exercise habits out of respect for the pain. EKG today shows bradycardia and no concern for acute problem Referred to cardiology for their opinion  Bradycardia Asymptomatic, reports long Hx of HR in the 50s Has been doing regular aerobic exercise daily., possible athletic hear   Orders Placed This Encounter  Procedures  . Ambulatory referral to Cardiology    Referral Priority:   Routine    Referral Type:   Consultation    Referral Reason:   Specialty Services Required    Referred to Provider:    Laqueta LindenKoneswaran, Suresh A, MD    Requested Specialty:   Cardiology    Number of Visits Requested:   1  . EKG 12-Lead     Murtis SinkSam Bradshaw, MD [facility] 08/24/2017, 5:05 PM

## 2017-10-25 ENCOUNTER — Other Ambulatory Visit: Payer: Self-pay | Admitting: Family Medicine

## 2018-09-20 ENCOUNTER — Telehealth: Payer: Self-pay | Admitting: Family Medicine

## 2018-10-10 IMAGING — DX DG LUMBAR SPINE 2-3V
2 series · 2 of 2 positions shown · non-contrast
Comparison: None.

CLINICAL DATA: Low back pain without known trauma.

EXAM:
LUMBAR SPINE - 2-3 VIEW

[l-spine ap]
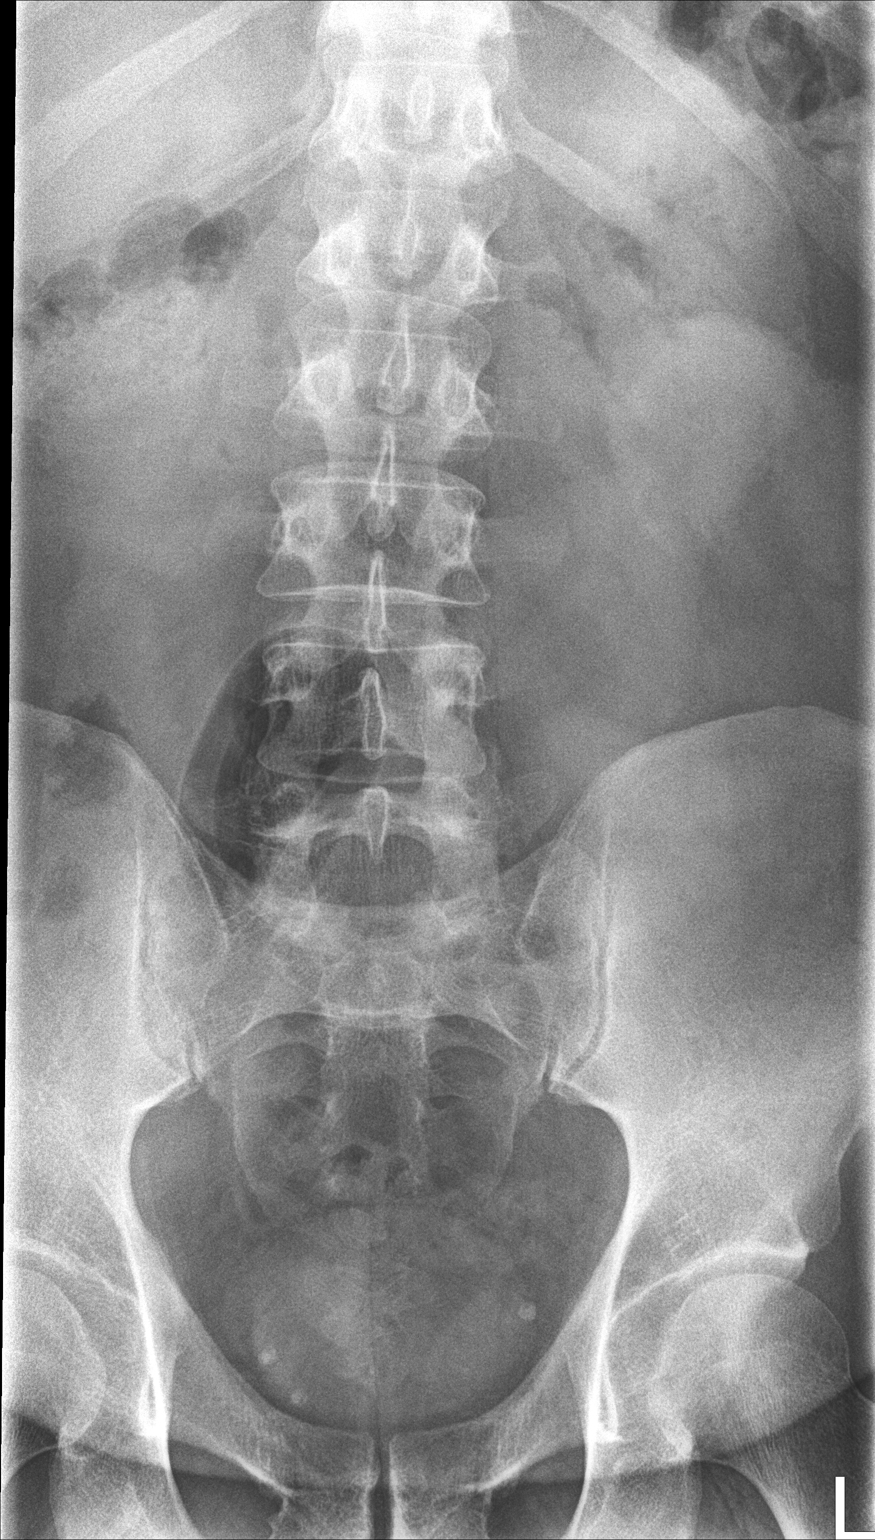

[l-spine lat]
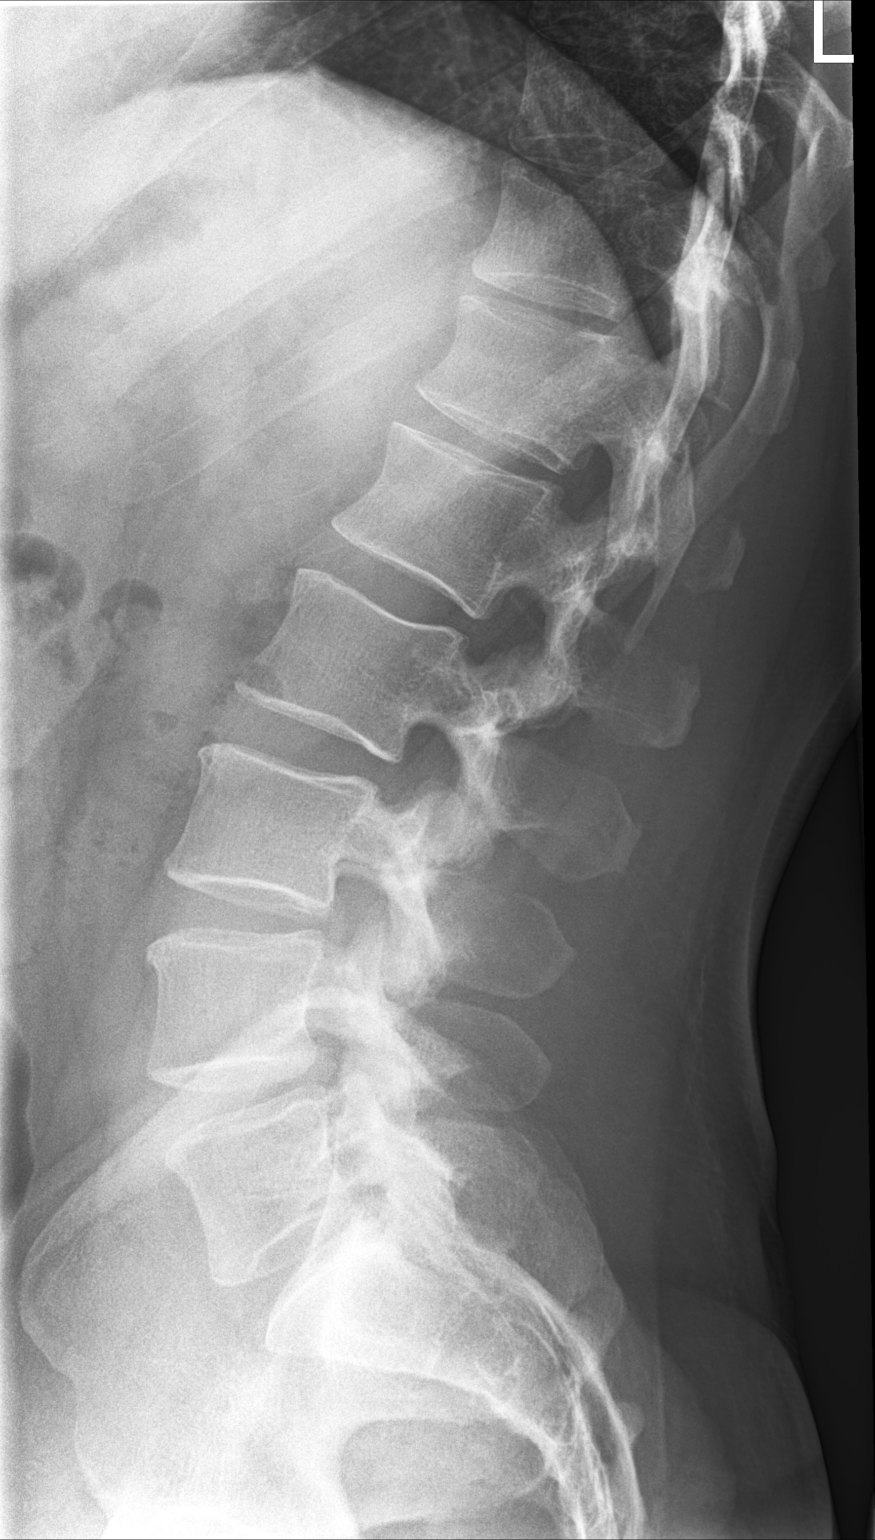

[2 of 2 positions shown; findings below may reference images not displayed]

FINDINGS: Mild curvature lumbar spine, apex to the right, could represent mild
scoliosis. Mild multilevel degenerative disc disease. No evidence of
fracture or traumatic malalignment.
IMPRESSION: Mild degenerative disc disease.

## 2018-10-31 ENCOUNTER — Other Ambulatory Visit: Payer: Self-pay

## 2018-11-01 ENCOUNTER — Ambulatory Visit (INDEPENDENT_AMBULATORY_CARE_PROVIDER_SITE_OTHER): Payer: Self-pay | Admitting: Family

## 2018-11-01 ENCOUNTER — Encounter: Payer: Self-pay | Admitting: Family

## 2018-11-01 VITALS — BP 136/92 | HR 65 | Temp 98.4°F | Ht 70.0 in | Wt 205.2 lb

## 2018-11-01 DIAGNOSIS — I10 Essential (primary) hypertension: Secondary | ICD-10-CM

## 2018-11-01 DIAGNOSIS — K219 Gastro-esophageal reflux disease without esophagitis: Secondary | ICD-10-CM

## 2018-11-01 DIAGNOSIS — E663 Overweight: Secondary | ICD-10-CM

## 2018-11-01 MED ORDER — HYDROCHLOROTHIAZIDE 12.5 MG PO TABS: 12.5000 mg | ORAL_TABLET | Freq: Every day | ORAL | 3 refills | Status: DC

## 2018-11-01 NOTE — Progress Notes (Signed)
Subjective:    Patient ID: Alejandro Delgado, male    DOB: 10/06/1970, 48 y.o.   MRN: [id number]  Chief Complaint  Patient presents with  . Hypertension   Pt presents to the office today for recurrent HTN. PT checks his BP at home and has been running around 140's/90's for the last two weeks. He states his wife just had a stroke and started checking his BP.  Hypertension This is a new problem. The current episode started more than 1 year ago. The problem has been waxing and waning since onset. The problem is uncontrolled. Pertinent negatives include no peripheral edema or shortness of breath.  Gastroesophageal Reflux He complains of heartburn. He reports no belching. This is a chronic problem. The current episode started more than 1 year ago. The problem occurs occasionally. He has tried a PPI for the symptoms. The treatment provided moderate relief.      Review of Systems  Respiratory: Negative for shortness of breath.   Gastrointestinal: Positive for heartburn.  All other systems reviewed and are negative.      Objective:   Physical Exam Vitals signs reviewed.  Constitutional:      General: He is not in acute distress.    Appearance: He is well-developed.  HENT:     Head: Normocephalic.     Right Ear: Tympanic membrane normal.     Left Ear: Tympanic membrane normal.  Eyes:     General:        Right eye: No discharge.        Left eye: No discharge.     Pupils: Pupils are equal, round, and reactive to light.  Neck:     Musculoskeletal: Normal range of motion and neck supple.     Thyroid: No thyromegaly.  Cardiovascular:     Rate and Rhythm: Normal rate and regular rhythm.     Heart sounds: Normal heart sounds. No murmur.  Pulmonary:     Effort: Pulmonary effort is normal. No respiratory distress.     Breath sounds: Normal breath sounds. No wheezing.  Abdominal:     General: Bowel sounds are normal. There is no distension.     Palpations: Abdomen is soft.     Tenderness:  There is no abdominal tenderness.  Musculoskeletal: Normal range of motion.        General: No tenderness.  Skin:    General: Skin is warm and dry.     Findings: No erythema or rash.  Neurological:     Mental Status: He is alert and oriented to person, place, and time.     Cranial Nerves: No cranial nerve deficit.     Deep Tendon Reflexes: Reflexes are normal and symmetric.  Psychiatric:        Behavior: Behavior normal.        Thought Content: Thought content normal.        Judgment: Judgment normal.       BP (!) 136/92 (BP Location: Right Arm, Patient Position: Sitting, Cuff Size: Normal)   Pulse 65   Temp 98.4 F (36.9 C) (Oral)   Ht '5\' 10"'  (1.778 m)   Wt 205 lb 3.2 oz (93.1 kg)   BMI 29.44 kg/m      Assessment & Plan:  Joshuah Minella comes in today with chief complaint of Hypertension   Diagnosis and orders addressed:  1. Essential hypertension Will start HCTZ 12.5 mg today -Daily blood pressure log given with instructions on how to fill out and told  to bring to next visit -Dash diet information given -Exercise encouraged - Stress Management  -Continue current meds -RTO in 2 weeks - hydrochlorothiazide (HYDRODIURIL) 12.5 MG tablet; Take 1 tablet (12.5 mg total) by mouth daily.  Dispense: 90 tablet; Refill: 3 - CMP14+EGFR  2. Gastroesophageal reflux disease, esophagitis presence not specified - CMP14+EGFR  3. Overweight (BMI 25.0-29.9) - CMP14+EGFR  Evelina Dun, FNP

## 2018-11-01 NOTE — Patient Instructions (Signed)

## 2018-11-02 LAB — CMP14+EGFR
ALT: 42 IU/L (ref 0–44)
AST: 32 IU/L (ref 0–40)
Albumin/Globulin Ratio: 1.7 (ref 1.2–2.2)
Albumin: 4.3 g/dL (ref 4.0–5.0)
Alkaline Phosphatase: 92 IU/L (ref 39–117)
BUN/Creatinine Ratio: 10 (ref 9–20)
BUN: 12 mg/dL (ref 6–24)
Bilirubin Total: <0.2 mg/dL (ref 0.0–1.2)
CO2: 23 mmol/L (ref 20–29)
Calcium: 9.2 mg/dL (ref 8.7–10.2)
Chloride: 99 mmol/L (ref 96–106)
Creatinine, Ser: 1.16 mg/dL (ref 0.76–1.27)
GFR calc Af Amer: 86 mL/min/{1.73_m2} (ref >59)
GFR calc non Af Amer: 75 mL/min/{1.73_m2} (ref >59)
Globulin, Total: 2.6 g/dL (ref 1.5–4.5)
Glucose: 98 mg/dL (ref 65–99)
Potassium: 4.5 mmol/L (ref 3.5–5.2)
Sodium: 140 mmol/L (ref 134–144)
Total Protein: 6.9 g/dL (ref 6.0–8.5)

## 2018-11-20 ENCOUNTER — Ambulatory Visit: Payer: Self-pay | Admitting: Family

## 2018-12-25 DIAGNOSIS — I10 Essential (primary) hypertension: Secondary | ICD-10-CM

## 2018-12-26 MED ORDER — HYDROCHLOROTHIAZIDE 25 MG PO TABS: 25.0000 mg | ORAL_TABLET | Freq: Every day | ORAL | 0 refills | Status: DC

## 2018-12-27 ENCOUNTER — Other Ambulatory Visit: Payer: Self-pay | Admitting: Family

## 2018-12-27 DIAGNOSIS — I10 Essential (primary) hypertension: Secondary | ICD-10-CM

## 2018-12-27 MED ORDER — HYDROCHLOROTHIAZIDE 25 MG PO TABS: 25.0000 mg | ORAL_TABLET | Freq: Every day | ORAL | 0 refills | Status: AC

## 2022-03-28 ENCOUNTER — Encounter (INDEPENDENT_AMBULATORY_CARE_PROVIDER_SITE_OTHER): Payer: Self-pay | Admitting: Gastroenterology
